# Patient Record
Sex: Female | Born: 1960 | Hispanic: Yes | Marital: Married
Health system: Southern US, Community
[De-identification: ages and names within clinical notes are randomized; demographics above are authoritative.]

---

## 2020-02-13 ENCOUNTER — Emergency Department (HOSPITAL_COMMUNITY)
Admission: EM | Admit: 2020-02-13 | Discharge: 2020-02-13 | Disposition: A | Discharge status: Home / Self Care | Payer: Self-pay | Attending: Emergency Medicine | Admitting: Emergency Medicine

## 2020-02-13 ENCOUNTER — Emergency Department (HOSPITAL_COMMUNITY): Payer: Self-pay

## 2020-02-13 ENCOUNTER — Encounter (HOSPITAL_COMMUNITY): Payer: Self-pay

## 2020-02-13 ENCOUNTER — Other Ambulatory Visit: Payer: Self-pay

## 2020-02-13 DIAGNOSIS — W000XXA Fall on same level due to ice and snow, initial encounter: Secondary | ICD-10-CM | POA: Insufficient documentation

## 2020-02-13 DIAGNOSIS — S52042A Displaced fracture of coronoid process of left ulna, initial encounter for closed fracture: Secondary | ICD-10-CM | POA: Insufficient documentation

## 2020-02-13 DIAGNOSIS — S53105A Unspecified dislocation of left ulnohumeral joint, initial encounter: Secondary | ICD-10-CM

## 2020-02-13 DIAGNOSIS — W19XXXA Unspecified fall, initial encounter: Secondary | ICD-10-CM

## 2020-02-13 MED ORDER — SODIUM CHLORIDE 0.9 % IV BOLUS
1000.0000 mL | Freq: Once | INTRAVENOUS | Status: AC
  Administered 2020-02-13: 1000 mL via INTRAVENOUS

## 2020-02-13 MED ORDER — PROPOFOL 10 MG/ML IV BOLUS
INTRAVENOUS | Status: AC | PRN
  Administered 2020-02-13 (×2): 90.7 mg via INTRAVENOUS

## 2020-02-13 MED ORDER — ACETAMINOPHEN 325 MG PO TABS
650.0000 mg | ORAL_TABLET | Freq: Once | ORAL | Status: AC
  Administered 2020-02-13: 650 mg via ORAL
  Filled 2020-02-13: qty 2

## 2020-02-13 MED ORDER — MORPHINE SULFATE (PF) 4 MG/ML IV SOLN
4.0000 mg | Freq: Once | INTRAVENOUS | Status: AC
  Administered 2020-02-13: 4 mg via INTRAVENOUS
  Filled 2020-02-13: qty 1

## 2020-02-13 MED ORDER — HYDROCODONE-ACETAMINOPHEN 5-325 MG PO TABS
2.0000 | ORAL_TABLET | ORAL | 0 refills | Status: AC | PRN
Start: 2020-02-13 — End: ?

## 2020-02-13 MED ORDER — PROPOFOL 10 MG/ML IV BOLUS
0.5000 mg/kg | Freq: Once | INTRAVENOUS | Status: AC
  Administered 2020-02-13: 45.4 mg via INTRAVENOUS
  Filled 2020-02-13: qty 20

## 2020-02-13 NOTE — ED Provider Notes (Signed)
.  Sedation  Date/Time: 02/13/2020 7:00 PM Performed by: Pricilla Loveless, MD Authorized by: Pricilla Loveless, MD   Consent:    Consent obtained:  Verbal and written   Consent given by:  Patient   Risks discussed:  Allergic reaction, dysrhythmia, inadequate sedation, nausea, prolonged hypoxia resulting in organ damage, prolonged sedation necessitating reversal, respiratory compromise necessitating ventilatory assistance and intubation and vomiting   Alternatives discussed:  Analgesia without sedation, anxiolysis and regional anesthesia Universal protocol:    Procedure explained and questions answered to patient or proxy's satisfaction: yes     Relevant documents present and verified: yes     Test results available: yes     Imaging studies available: yes     Required blood products, implants, devices, and special equipment available: yes     Site/side marked: yes     Immediately prior to procedure, a time out was called: yes     Patient identity confirmed:  Verbally with patient Indications:    Procedure necessitating sedation performed by:  Physician performing sedation Pre-sedation assessment:    Time since last food or drink:  6 hours   ASA classification: class 1 - normal, healthy patient     Mouth opening:  3 or more finger widths   Thyromental distance:  4 finger widths   Mallampati score:  I - soft palate, uvula, fauces, pillars visible   Neck mobility: normal     Pre-sedation assessments completed and reviewed: airway patency, cardiovascular function, hydration status, mental status, nausea/vomiting, pain level, respiratory function and temperature   Immediate pre-procedure details:    Reassessment: Patient reassessed immediately prior to procedure     Reviewed: vital signs, relevant labs/tests and NPO status     Verified: bag valve mask available, emergency equipment available, intubation equipment available, IV patency confirmed, oxygen available and suction available    Procedure details (see MAR for exact dosages):    Preoxygenation:  Nasal cannula   Sedation:  Propofol   Intended level of sedation: deep   Intra-procedure monitoring:  Blood pressure monitoring, cardiac monitor, continuous pulse oximetry, frequent LOC assessments, frequent vital sign checks and continuous capnometry   Intra-procedure events: none     Total Provider sedation time (minutes):  5 Post-procedure details:    Attendance: Constant attendance by certified staff until patient recovered     Recovery: Patient returned to pre-procedure baseline     Post-sedation assessments completed and reviewed: airway patency, cardiovascular function, hydration status, mental status, nausea/vomiting, pain level, respiratory function and temperature     Patient is stable for discharge or admission: yes     Procedure completion:  Tolerated well, no immediate complications  Patient with closed elbow dislocation.  See reduction note.  Sedation performed without any obvious complication.  Will refer to orthopedics.    Pricilla Loveless, MD 02/13/20 [phone]

## 2020-02-13 NOTE — Progress Notes (Signed)
Orthopedic Tech Progress Note Patient Details:  Diane Bradshaw 08/05/1960 [id number] spoke with PA about patient splint. they're doing a reduction of elbow. MD is with a patient at the moment and I asked her to call when ready. She had the wong number gave her the right one. Supplies are at the bedside. Patient ID: Diane Bradshaw, female   DOB: Jun 07, 1960, 60 y.o.   MRN: [id number]   Diane Bradshaw 02/13/2020, 6:26 PM

## 2020-02-13 NOTE — Discharge Instructions (Addendum)
Llame a la oficina de ortopedia maana para programar una cita.  No moje la frula.  No retire la frula hasta que vea al mdico ortopdico.

## 2020-02-13 NOTE — ED Triage Notes (Signed)
Pt slipped on ice today, pain to left elbow

## 2020-02-13 NOTE — Sedation Documentation (Signed)
The pt is fully alert but drowsy

## 2020-02-13 NOTE — ED Provider Notes (Addendum)
[facility] EMERGENCY DEPARTMENT Provider Note   CSN: [id number] Arrival date & time: 02/13/20  1335     History Chief Complaint  Patient presents with  . Fall  . Elbow Injury    Diane Bradshaw is a 60 y.o. female with no significant past medical history who had a witnessed mechanical fall earlier today.  Denies hitting head, LOC or anticoagulation.  Left straight on her left elbow.  Noted deformity and pain since.  Admits there is been some mild swelling.  Rates her pain a 9/10.  Denies headache, lightheadedness, dizziness, paresthesias, weakness, lacerations, contusions.  Denies additional aggravating or alleviating factors.  Not followed by orthopedics.  History obtained from patient and past medical records.  No interpreter used.  HPI     History reviewed. No pertinent past medical history.  There are no problems to display for this patient.   History reviewed. No pertinent surgical history.   OB History   No obstetric history on file.     No family history on file.     Home Medications Prior to Admission medications   Medication Sig Start Date End Date Taking? Authorizing Provider  HYDROcodone-acetaminophen (NORCO/VICODIN) 5-325 MG tablet Take 2 tablets by mouth every 4 (four) hours as needed. 02/13/20  Yes Briya Lookabaugh A, PA-C    Allergies    Patient has no allergy information on record.  Review of Systems   Review of Systems  Constitutional: Negative.   HENT: Negative.   Respiratory: Negative.   Cardiovascular: Negative.   Gastrointestinal: Negative.   Genitourinary: Negative.   Musculoskeletal:       Left elbow pain  Skin: Negative.   Neurological: Negative.   All other systems reviewed and are negative.   Physical Exam Updated Vital Signs BP (!) 173/94   Pulse 65   Temp 99.5 F (37.5 C)   Resp 16   Wt 90.7 kg   SpO2 (!) 96%   Physical Exam Vitals and nursing note reviewed.  Constitutional:      General: She is  not in acute distress.    Appearance: She is well-developed and well-nourished. She is not ill-appearing, toxic-appearing or diaphoretic.  HENT:     Head: Normocephalic and atraumatic.     Nose: Nose normal.     Mouth/Throat:     Mouth: Mucous membranes are moist.  Eyes:     Pupils: Pupils are equal, round, and reactive to light.  Cardiovascular:     Rate and Rhythm: Normal rate.     Pulses: Normal pulses and intact distal pulses.          Radial pulses are 2+ on the right side and 2+ on the left side.     Heart sounds: Normal heart sounds.  Pulmonary:     Effort: Pulmonary effort is normal. No respiratory distress.     Breath sounds: Normal breath sounds.  Abdominal:     General: Bowel sounds are normal. There is no distension.  Musculoskeletal:        General: Normal range of motion.     Cervical back: Normal range of motion.     Comments: Obvious deformity to left elbow.  Skin:    General: Skin is warm and dry.     Capillary Refill: Capillary refill takes less than 2 seconds.     Comments: No edema, erythema, warmth  Neurological:     General: No focal deficit present.     Mental Status: She is alert and  oriented to person, place, and time.     Sensory: Sensation is intact.     Motor: Motor function is intact.     Gait: Gait is intact.     Comments: Intact sensation  Psychiatric:        Mood and Affect: Mood and affect normal.    ED Results / Procedures / Treatments   Labs (all labs ordered are listed, but only abnormal results are displayed) Labs Reviewed - No data to display  EKG None  Radiology DG Elbow 2 Views Left  Result Date: 02/13/2020 CLINICAL DATA:  Status post reduction EXAM: LEFT ELBOW - 2 VIEW COMPARISON:  Films from earlier in the same day. FINDINGS: There is been interval reduction of the previously seen elbow dislocation. Small fracture fragment from the coronoid process is noted but in near anatomic alignment. No other focal abnormality is seen.  IMPRESSION: Status post reduction. Coronoid fracture is again identified. Electronically Signed   By: Alcide Clever M.D.   On: 02/13/2020 19:51   DG Elbow Complete Left  Result Date: 02/13/2020 CLINICAL DATA:  Fall with elbow pain EXAM: LEFT ELBOW - COMPLETE 3+ VIEW COMPARISON:  None. FINDINGS: Posterior dislocation of the proximal ulna and radius with respect to the distal humerus. Small fracture fragment along the dorsal surface of the proximal ulna, probably from the coronoid process. IMPRESSION: Posterior dislocation of the proximal ulna and radius with respect to the distal humerus. Small fracture fragment along the dorsal surface of the proximal ulna, probably from the coronoid process. Electronically Signed   By: Jasmine Pang M.D.   On: 02/13/2020 15:28    Procedures Reduction of dislocation  Date/Time: 02/13/2020 7:03 PM Performed by: Linwood Dibbles, PA-C Authorized by: Linwood Dibbles, PA-C  Consent: Verbal consent obtained. Written consent not obtained. Risks and benefits: risks, benefits and alternatives were discussed Consent given by: patient Patient understanding: patient states understanding of the procedure being performed Patient consent: the patient's understanding of the procedure matches consent given Procedure consent: procedure consent matches procedure scheduled Relevant documents: relevant documents present and verified Test results: test results available and properly labeled Site marked: the operative site was marked Imaging studies: imaging studies available Required items: required blood products, implants, devices, and special equipment available Patient identity confirmed: verbally with patient and arm band Time out: Immediately prior to procedure a "time out" was called to verify the correct patient, procedure, equipment, support staff and site/side marked as required. Preparation: Patient was prepped and draped in the usual sterile fashion. Local  anesthesia used: no  Anesthesia: Local anesthesia used: no  Sedation: Patient sedated: yes (See attending note) Sedation type: moderate (conscious) sedation  Patient tolerance: patient tolerated the procedure well with no immediate complications  .Splint Application  Date/Time: 02/13/2020 7:04 PM Performed by: Linwood Dibbles, PA-C Authorized by: Linwood Dibbles, PA-C   Consent:    Consent obtained:  Verbal   Consent given by:  Patient   Risks, benefits, and alternatives were discussed: yes     Risks discussed:  Discoloration, numbness, pain and swelling   Alternatives discussed:  Referral, observation, alternative treatment and delayed treatment Universal protocol:    Procedure explained and questions answered to patient or proxy's satisfaction: yes     Relevant documents present and verified: yes     Test results available: yes     Imaging studies available: yes     Required blood products, implants, devices, and special equipment available: yes  Site/side marked: yes     Immediately prior to procedure a time out was called: yes     Patient identity confirmed:  Verbally with patient and arm band Pre-procedure details:    Distal neurologic exam:  Normal   Distal perfusion: distal pulses strong and brisk capillary refill   Procedure details:    Location:  Elbow   Elbow location:  L elbow   Strapping: yes     Cast type:  Long arm   Splint type:  Long arm   Supplies:  Fiberglass, sling and aluminum splint   Attestation: Splint applied and adjusted personally by me   Post-procedure details:    Distal neurologic exam:  Normal   Distal perfusion: distal pulses strong     Procedure completion:  Tolerated well, no immediate complications   Post-procedure imaging: reviewed     (including critical care time)  Medications Ordered in ED Medications  acetaminophen (TYLENOL) tablet 650 mg (650 mg Oral Given 02/13/20 1433)  morphine 4 MG/ML injection 4 mg (4 mg  Intravenous Given 02/13/20 1734)  propofol (DIPRIVAN) 10 mg/mL bolus/IV push 45.4 mg (45.4 mg Intravenous Given 02/13/20 1907)  sodium chloride 0.9 % bolus 1,000 mL (0 mLs Intravenous Stopped 02/13/20 2030)  propofol (DIPRIVAN) 10 mg/mL bolus/IV push (90.7 mg Intravenous Given 02/13/20 1852)   ED Course  I have reviewed the triage vital signs and the nursing notes.  Pertinent labs & imaging results that were available during my care of the patient were reviewed by me and considered in my medical decision making (see chart for details).  Patient presents for evaluation of mechanical fall which occurred just PTA.  Patient with obvious deformity to left elbow.  Denies hitting head, LOC or anticoagulation.  She is neurovascularly intact.  Mild swelling to left elbow.  No lacerations, ecchymosis.  Imaging obtained from triage shows dislocated left elbow with coronoid fracture.  We will plan on reduction, consult Ortho.  Successful reduction of dislocation.  Does show coronoid fracture.  See attending note for sedation.  CONSULT with Dr. Victorino DikeHewitt with Ortho CT elbow for possible pre-op and patient can follow up with Dr. Roney Mansreighton tomorrow in office. Patient to call for appointment.   Do not need results of CT prior to dc home.  Patient reassessed. Continues to be NV intact after splint placement.  Discussed Ortho FU with patient and family in room. Agreeable for follow up.  The patient has been appropriately medically screened and/or stabilized in the ED. I have low suspicion for any other emergent medical condition which would require further screening, evaluation or treatment in the ED or require inpatient management.  Patient is hemodynamically stable and in no acute distress.  Patient able to ambulate in department prior to ED.  Evaluation does not show acute pathology that would require ongoing or additional emergent interventions while in the emergency department or further inpatient treatment.  I  have discussed the diagnosis with the patient and answered all questions.  Pain is been managed while in the emergency department and patient has no further complaints prior to discharge.  Patient is comfortable with plan discussed in room and is stable for discharge at this time.  I have discussed strict return precautions for returning to the emergency department.  Patient was encouraged to follow-up with PCP/specialist refer to at discharge.     MDM Rules/Calculators/A&P  Final Clinical Impression(s) / ED Diagnoses Final diagnoses:  Fall, initial encounter  Dislocation of left elbow, initial encounter  Closed displaced fracture of coronoid process of left ulna, initial encounter    Rx / DC Orders ED Discharge Orders         Ordered    HYDROcodone-acetaminophen (NORCO/VICODIN) 5-325 MG tablet  Every 4 hours PRN        02/13/20 2202           Jenika Chiem A, PA-C 02/13/20 2206    Hartlee Amedee A, PA-C 02/13/20 2222    Pricilla Loveless, MD 02/13/20 2313

## 2020-02-13 NOTE — Progress Notes (Signed)
Orthopedic Tech Progress Note Patient Details:  Diane Bradshaw 1960-04-18 [id number] Added a little padding to splint Ortho Devices Type of Ortho Device: Long arm splint Ortho Device/Splint Location: LUE Ortho Device/Splint Interventions: Ordered,Application,Adjustment   Post Interventions Patient Tolerated: Well Instructions Provided: Care of device   Donald Pore 02/13/2020, 7:04 PM

## 2020-03-18 ENCOUNTER — Other Ambulatory Visit: Payer: Self-pay

## 2020-03-18 ENCOUNTER — Encounter: Payer: Self-pay | Admitting: Occupational Therapy

## 2020-03-18 ENCOUNTER — Ambulatory Visit: Payer: Self-pay | Attending: Orthopaedic Surgery | Admitting: Occupational Therapy

## 2020-03-18 DIAGNOSIS — M6281 Muscle weakness (generalized): Secondary | ICD-10-CM | POA: Insufficient documentation

## 2020-03-18 DIAGNOSIS — M25622 Stiffness of left elbow, not elsewhere classified: Secondary | ICD-10-CM | POA: Insufficient documentation

## 2020-03-18 NOTE — Patient Instructions (Signed)
Flexion (Passive)    Use su otra mano para flexionar el codo con el pulgar apuntando hacia el mismo hombro. NO fuerce este movimiento. Mantenga ____ segundos. Repita ____ veces. Haga ____ sesiones por da.  Copyright  VHI. All rights reserved.  Extension (Passive)    Coloque un libro grueso sobre la mesa y [employer] parte superior del brazo sobre el mismo. Tmese el antebrazo con su otra mano y aplique una fuerza constante hacia abajo y afuera para enderezar el codo. Mantenga ____ segundos. Repita ____ veces. Haga ____ sesiones por da. Actividad: Balancee un objeto, como un portafolio, para enderezar el codo.*  Copyright  VHI. All rights reserved.  Putty-in-Your-Hand    Apriete lentamente una masilla o pelota de goma blanda mientras respira normalmente. Repita con la otra mano. Repita la serie ____ veces. Haga ____ sesiones por da.  http://[email or web address].exer.us/885   Copyright  VHI. All rights reserved.

## 2020-03-18 NOTE — Therapy (Signed)
[facility] [address] [place], [place], [address] Phone: [phone]   Fax:  [phone]  Occupational Therapy Treatment  Patient Details  Name: Lisaanne Lawrie MRN: [id number] Date of Birth: May 30, 1960 Referring Provider (OT): Candi Leash III   Encounter Date: 03/18/2020   OT End of Session - 03/18/20 1516    Visit Number 1    Number of Visits 1    Authorization Type self pay    OT Start Time 1145    OT Stop Time 1230    OT Time Calculation (min) 45 min    Activity Tolerance Patient tolerated treatment well    Behavior During Therapy [facility] for tasks assessed/performed           History reviewed. No pertinent past medical history.  History reviewed. No pertinent surgical history.  There were no vitals filed for this visit.   Subjective Assessment - 03/18/20 1159    Subjective  Patient indicates minimal pain - prefers to have exercises taught for her to self advance    Currently in Pain? No/denies              [facility] OT Assessment - 03/18/20 0001      Assessment   Medical Diagnosis Elbow dislocation with coronoid fracture    Referring Provider (OT) Candi Leash III    Onset Date/Surgical Date 02/13/20    Hand Dominance Right    Prior Therapy NA      Prior Function   Level of Independence Independent with basic ADLs    Vocation Other (comment)   Homemaker   Vocation Requirements Homemaker    Leisure knitting, coloring books      ADL   Eating/Feeding Independent    Grooming --   difficulty brushing and styling hair   Upper Body Bathing --   difficulty washing hair with two hands   Upper Body Dressing Increased time      IADL   Prior Level of Function Light Housekeeping independent    Light Housekeeping Maintains house alone or with occasional assistance    Prior Level of Function Meal Prep independent    Meal Prep --   daughter assisting     Posture/Postural Control   Posture/Postural  Control No significant limitations      Sensation   Light Touch Appears Intact    Additional Comments Reports no numbness      ROM / Strength   AROM / PROM / Strength AROM;PROM;Strength      AROM   Overall AROM  Deficits    Overall AROM Comments 66-150 flex to ext elbow left      PROM   Overall PROM  Deficits    Overall PROM Comments 40-160 flex to ext left elbow      Strength   Overall Strength Deficits    Overall Strength Comments WFL Proximal shoulders - deficits elbow      Hand Function   Right Hand Gross Grasp Functional    Right Hand Grip (lbs) 35    Right Hand Lateral Pinch 16 lbs    Left Hand Gross Grasp Impaired    Left Hand Grip (lbs) 14    Left Hand Lateral Pinch 10 lbs                            OT Education - 03/18/20 1227    Education Details putty exercises and passive elbow flex/ext exercises in spanish  Person(s) Educated Patient    Methods Explanation;Demonstration;Tactile cues;Verbal cues;Handout    Comprehension Verbalized understanding;Returned demonstration                      Plan - 03/18/20 1516    Clinical Impression Statement Patient is a 60 yr old Spanish Speaking woman, who fell in the snow/ice on 02/13/20 and dislocated her left elbow with coranoid fracture.  Patient presents during OT evaluation with little pain, limited range of motion active and passive, and decreased strength in left hand.  Patient is no longer wearing her long arm splint.  Patient expressing interest in learning exercises to help her progress herself with elbow motion and hand strength.  This was completed this session, and patient able to return demonstrate.  No further OT warranted.    OT Occupational Profile and History Problem Focused Assessment - Including review of records relating to presenting problem    Occupational performance deficits (Please refer to evaluation for details): ADL's;IADL's    Body Structure / Function / Physical  Skills ADL;UE functional use;Flexibility;Pain;Strength;Edema;ROM    Rehab Potential Good    Clinical Decision Making Limited treatment options, no task modification necessary    Comorbidities Affecting Occupational Performance: None    Modification or Assistance to Complete Evaluation  No modification of tasks or assist necessary to complete eval    Consulted and Agree with Plan of Care Patient           Patient will benefit from skilled therapeutic intervention in order to improve the following deficits and impairments:   Body Structure / Function / Physical Skills: ADL,UE functional use,Flexibility,Pain,Strength,Edema,ROM       Visit Diagnosis: Stiffness of left elbow, not elsewhere classified - Plan: Ot plan of care cert/re-cert  Muscle weakness (generalized) - Plan: Ot plan of care cert/re-cert    Problem List There are no problems to display for this patient.   Collier Salina, OTR/L 03/18/2020, 3:22 PM  [facility] [address] [place], [place], [address] Phone: [phone]   Fax:  [phone]  Name: Codi Folkerts MRN: [id number] Date of Birth: Jan 27, 1960

## 2022-10-16 IMAGING — CT CT ELBOW*L* W/O CM
2 of 6 series · 14 of 46 positions shown, 18 images · non-contrast
Comparison: Radiograph 02/13/2020

CLINICAL DATA: Fracture, fall on ice

EXAM:
CT OF THE UPPER LEFT EXTREMITY WITHOUT CONTRAST
TECHNIQUE: Multidetector CT imaging of the upper left extremity was performed
according to the standard protocol.

[Series 4: extremity soft tissue · axial · 0.59mm/px · z∈[+995,+1135]mm · 13 of 79 slices shown, 16 images]
[im 6/79  soft-tissue]
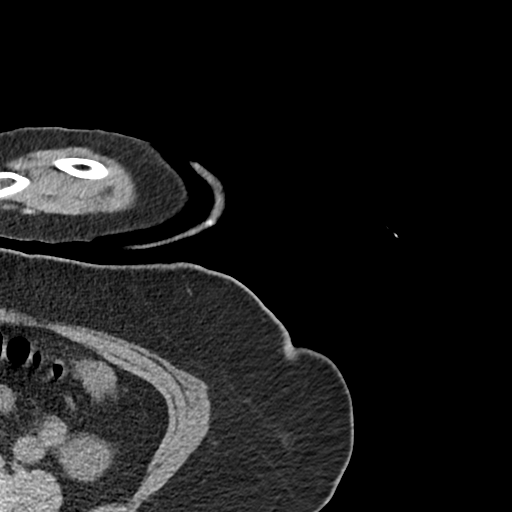
[im 6/79  bone]
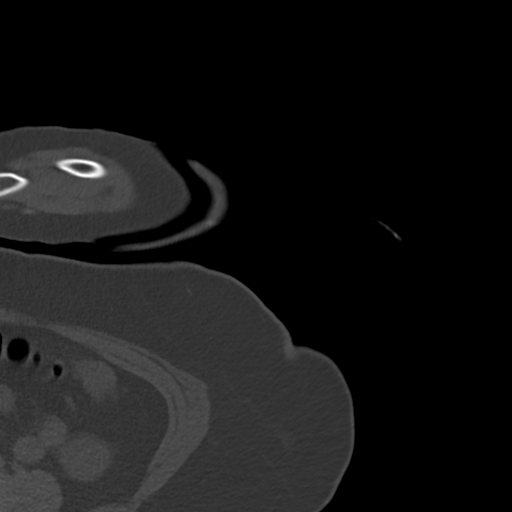
[im 13/79  soft-tissue]
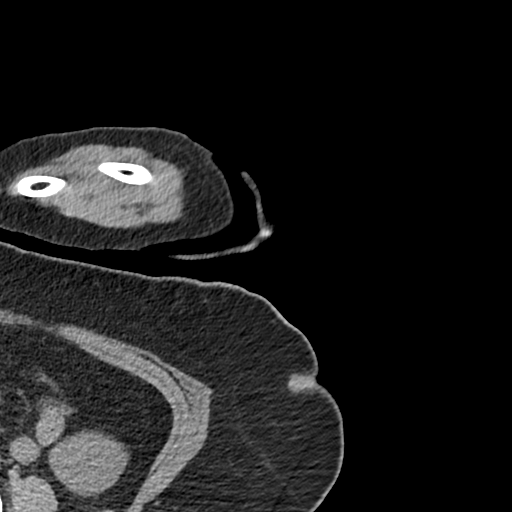
[im 21/79  soft-tissue]
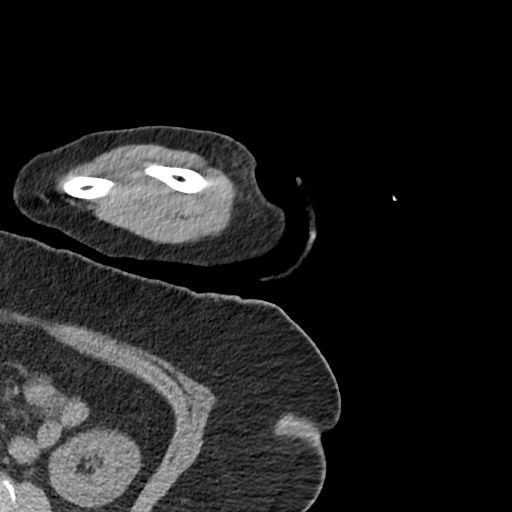
[im 28/79  soft-tissue]
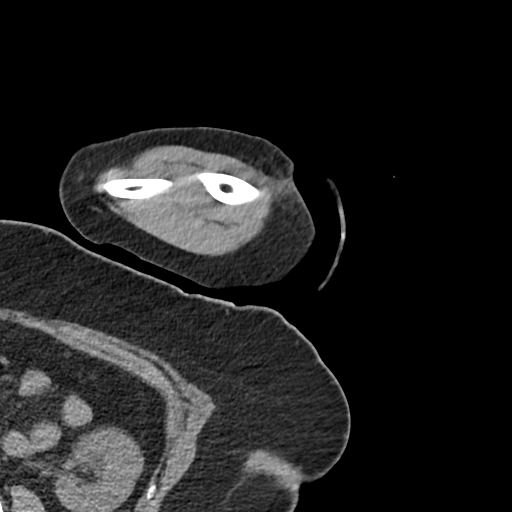
[im 36/79  soft-tissue]
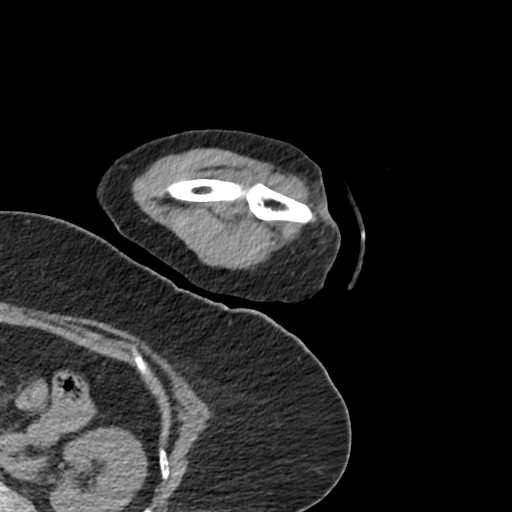
[im 43/79  soft-tissue]
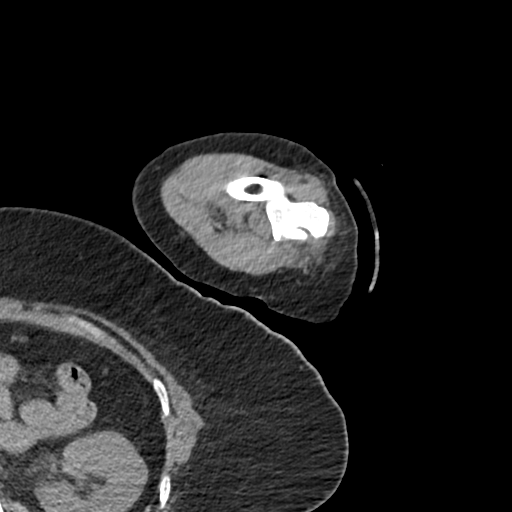
[im 51/79  soft-tissue]
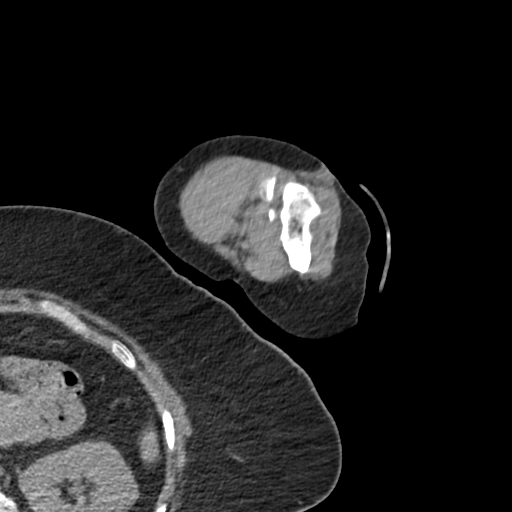
[im 58/79  soft-tissue]
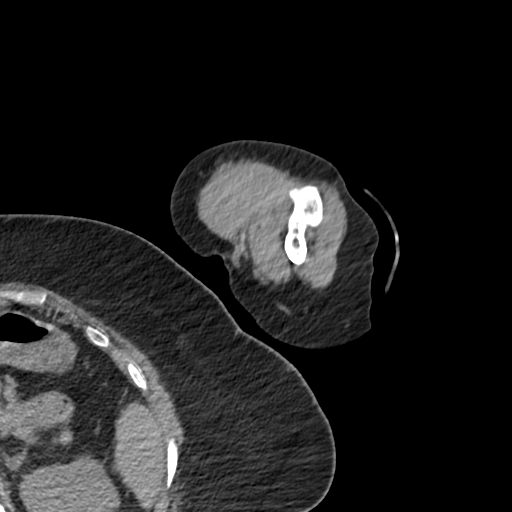
[im 66/79  soft-tissue]
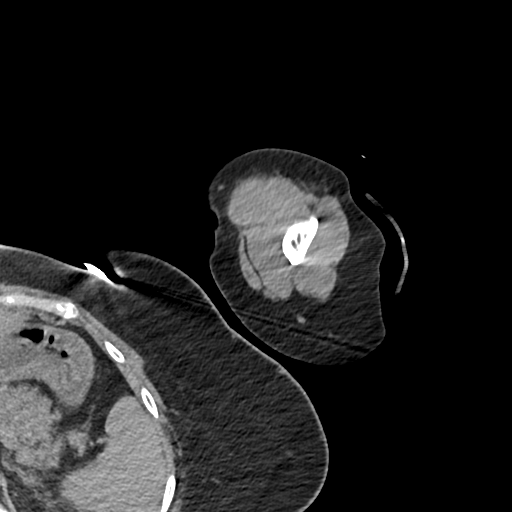
[im 66/79  bone]
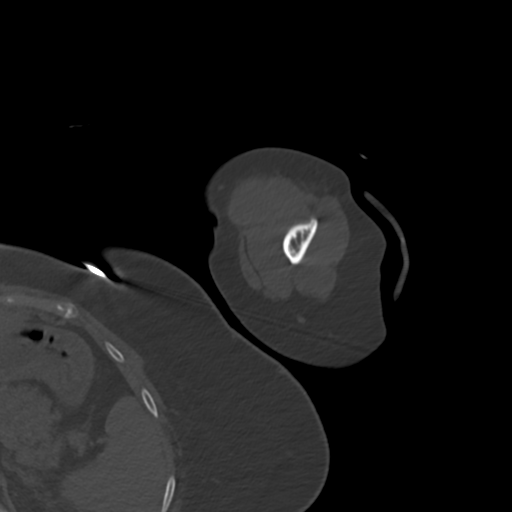
[im 68/79  lung]
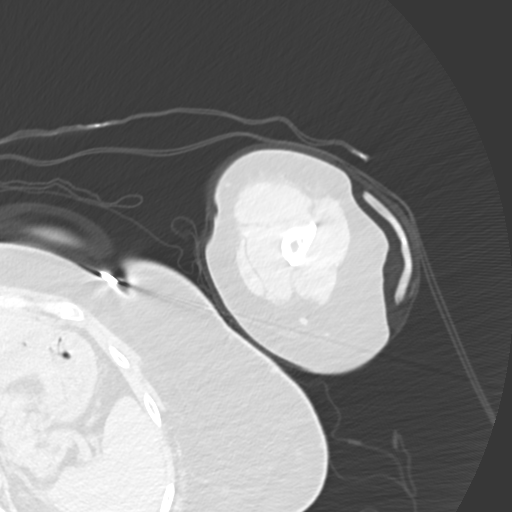
[im 71/79  lung]
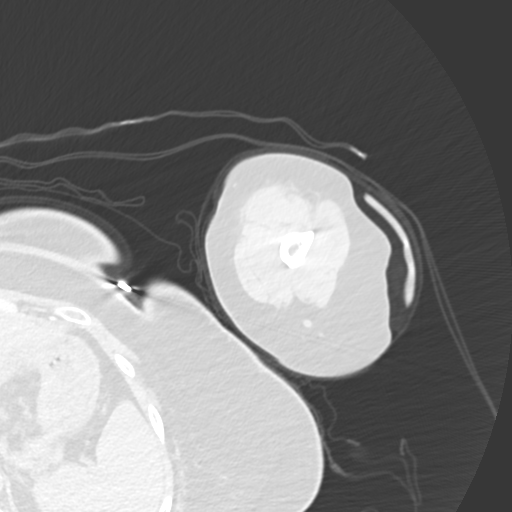
[im 73/79  soft-tissue]
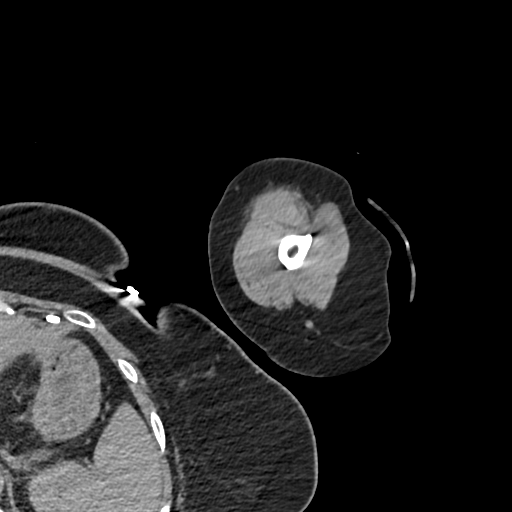
[im 73/79  lung]
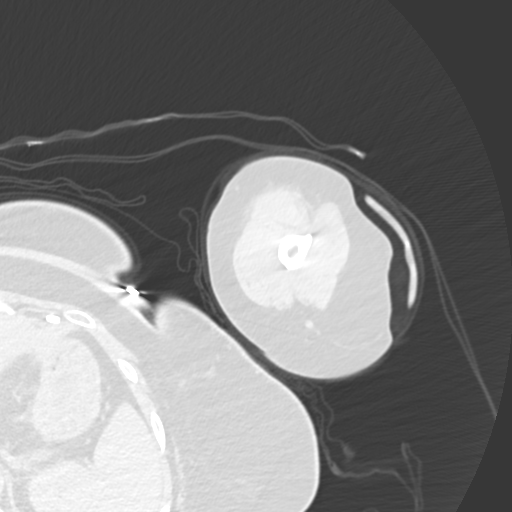
[im 76/79  lung]
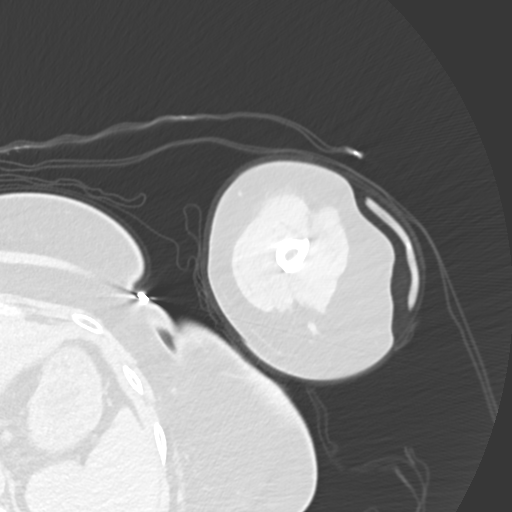

[Series 10: sag soft · coronal · 0.30mm/px · 1 of 72 slices shown, 2 images]
[im 36/72  soft-tissue]
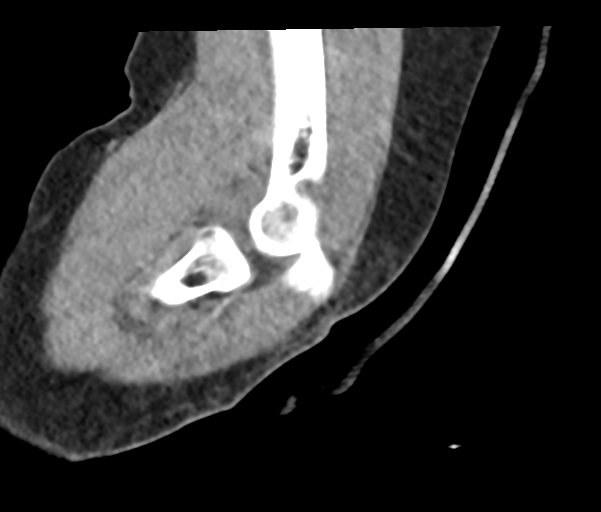
[im 36/72  bone]
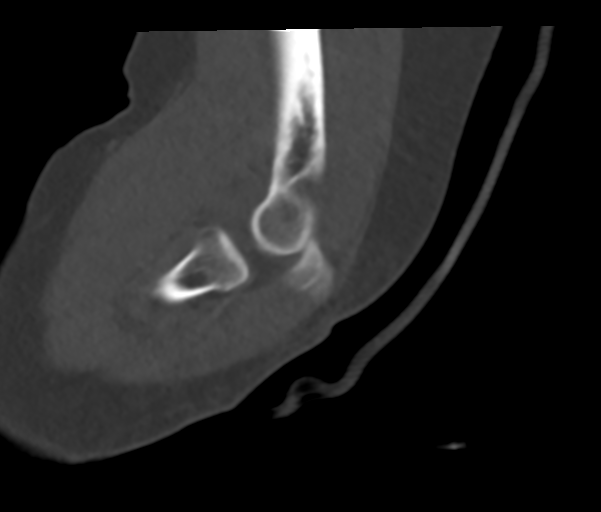

[14 of 46 positions shown; findings below may reference images not displayed]

FINDINGS: Bones/Joint/Cartilage

Successful reduction of the previously seen elbow dislocation. There
is a minimally displaced fracture fragment through the tip of the
coronoid. An additional nondisplaced fracture line is seen through
the base of the coronoid process with extension into the trochlear
articular surface (9/33). There is a minimally impacted fracture of
the radial head neck junction as well. There is slight motion
degradation of the imaging which appears to result in artifactual
cortical step-off along the lateral epicondyle though should
correlate for focal point tenderness. Small effusion with
lipohemarthrosis.

Ligaments

Suboptimally assessed by CT.

Muscles and Tendons

No intramuscular hematoma or collection. No focally retracted or
torn tendons are evident. No other acute muscular tendinous injury.

Soft tissues

Mild soft tissue swelling is noted superficial to the olecranon
process. Overlying casting material is present.
IMPRESSION: 1. Successful reduction of the previously seen elbow dislocation.
2. Minimally displaced fracture fragment through the tip of the
coronoid process.
3. Additional nondisplaced fracture line through the base of the
coronoid process with extension into the trochlear articular
surface.
4. Minimally impacted fracture of the radial head neck junction.
5. Slight motion degradation of the imaging which appears to result
in artifactual cortical step-off along the lateral epicondyle though
should correlate for focal point tenderness to exclude a
nondisplaced injury.
6. Small effusion with lipohemarthrosis.

## 2022-10-16 IMAGING — DX DG ELBOW 2V*L*
3 series · 3 of 3 positions shown · non-contrast
Comparison: Films from earlier in the same day.

CLINICAL DATA: Status post reduction

EXAM:
LEFT ELBOW - 2 VIEW

[elbow ap (1 of 2)]
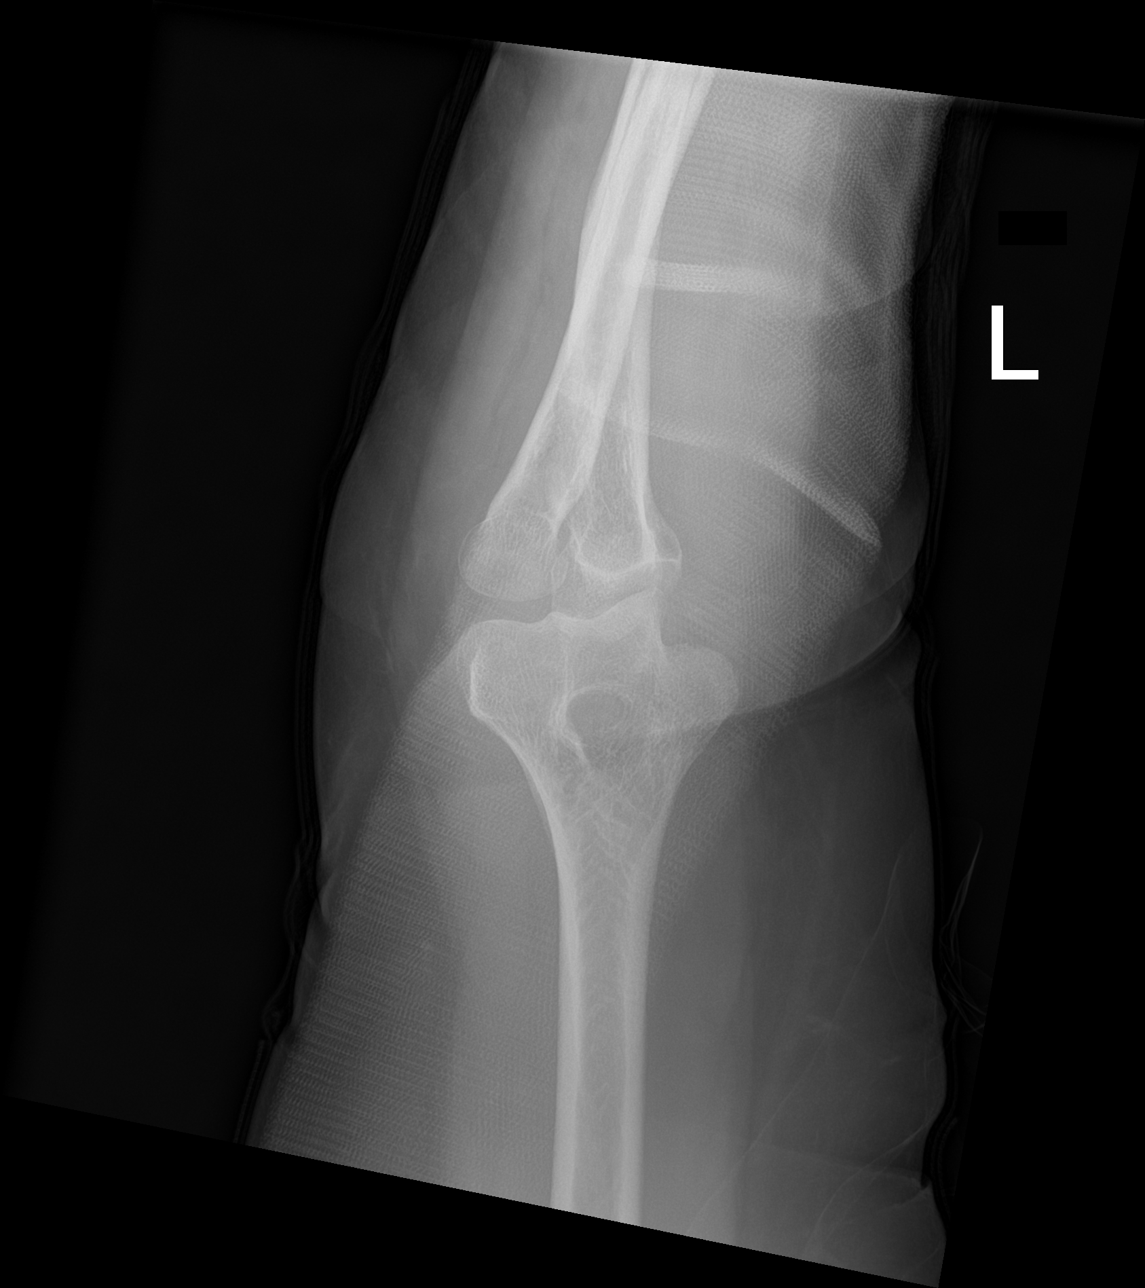

[elbow lat]
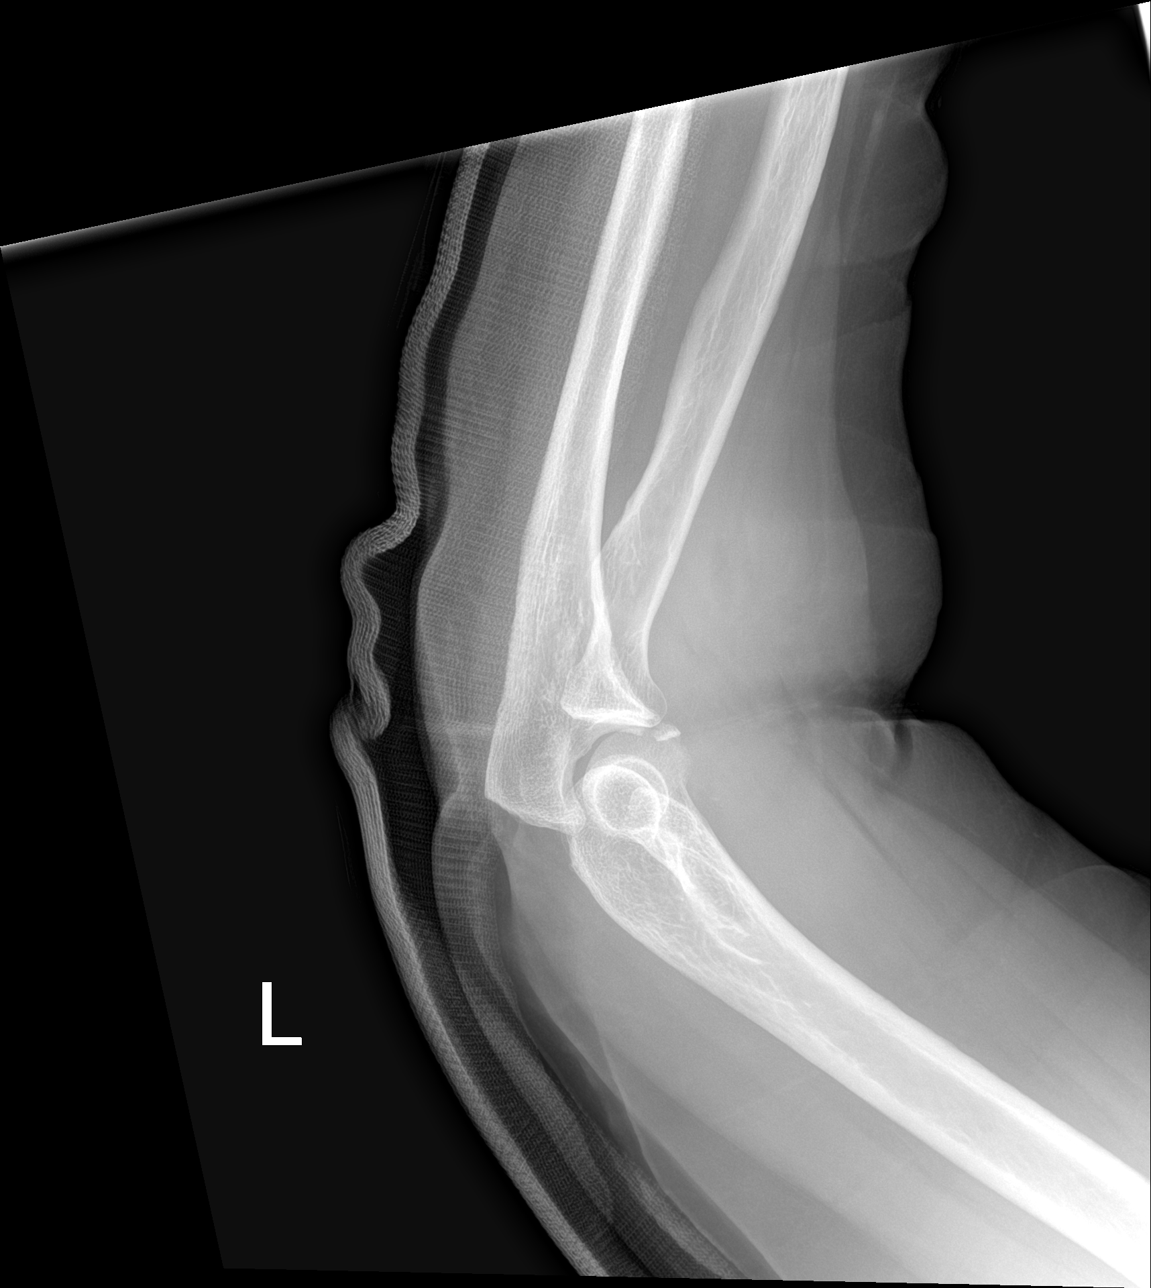

[elbow ap (2 of 2)]
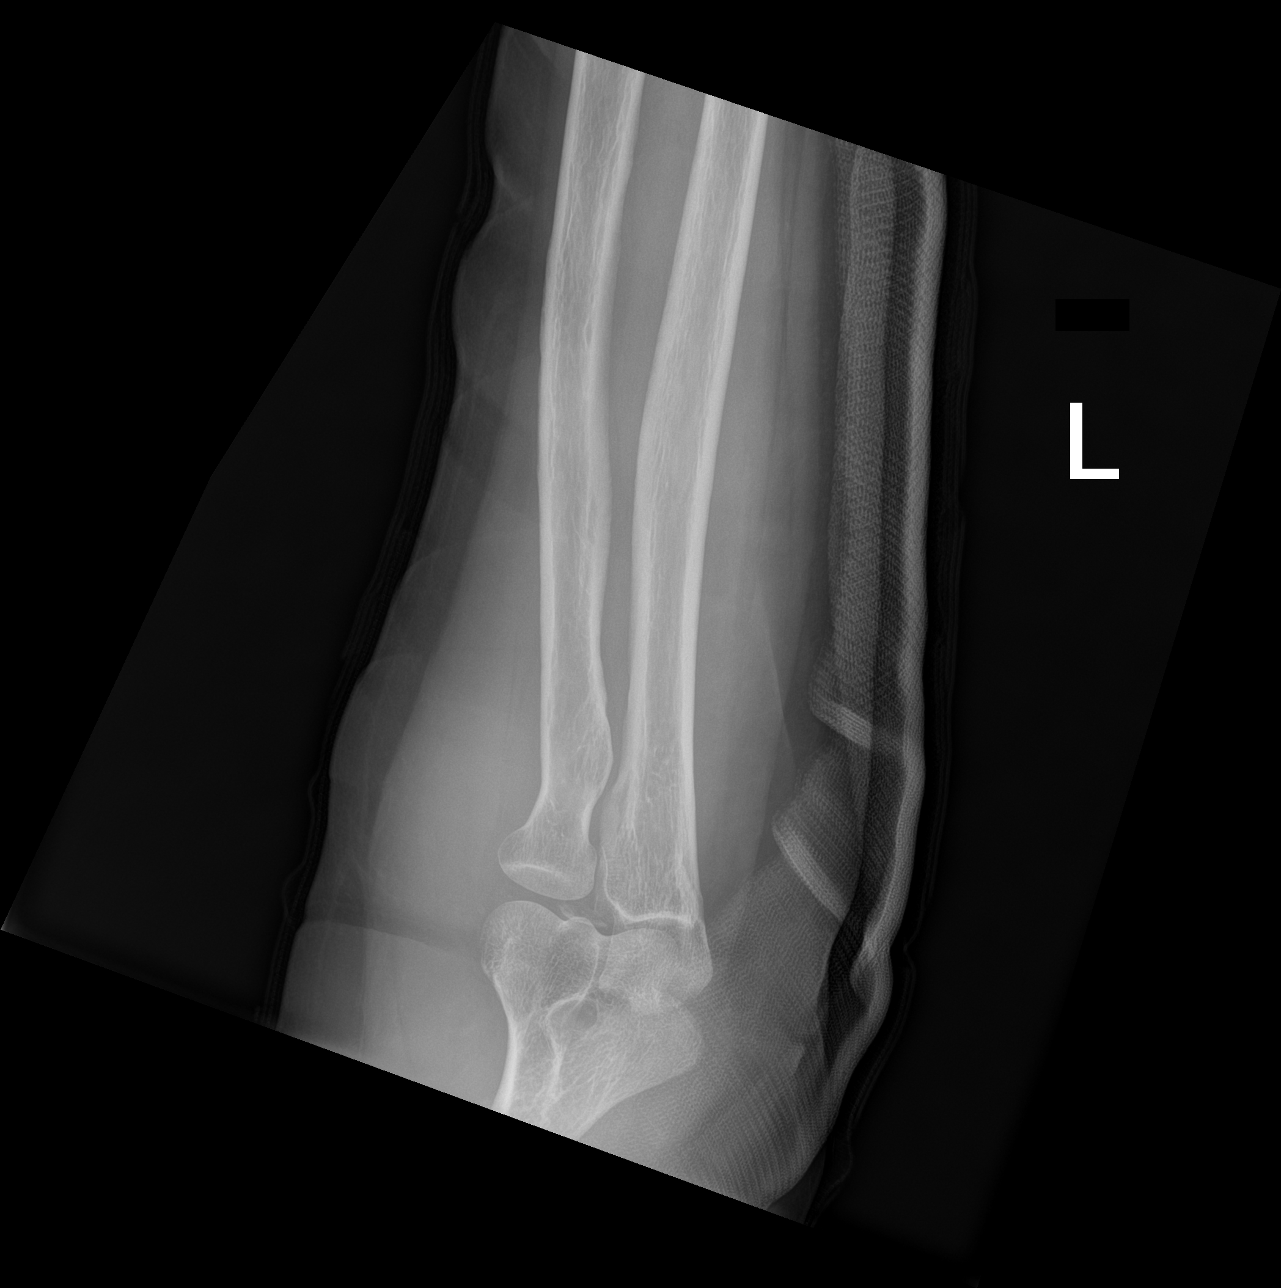

[3 of 3 positions shown; findings below may reference images not displayed]

FINDINGS: There is been interval reduction of the previously seen elbow
dislocation. Small fracture fragment from the coronoid process is
noted but in near anatomic alignment. No other focal abnormality is
seen.
IMPRESSION: Status post reduction.

Coronoid fracture is again identified.
